# Patient Record
Sex: Female | Born: 1945 | Race: Black or African American | Hispanic: No | Marital: Married | Smoking: Never smoker
Health system: Southern US, Community
[De-identification: ages and names within clinical notes are randomized; demographics above are authoritative.]

## PROBLEM LIST (undated history)

## (undated) DIAGNOSIS — I10 Essential (primary) hypertension: Secondary | ICD-10-CM

## (undated) DIAGNOSIS — E7800 Pure hypercholesterolemia, unspecified: Secondary | ICD-10-CM

## (undated) DIAGNOSIS — I2510 Atherosclerotic heart disease of native coronary artery without angina pectoris: Secondary | ICD-10-CM

## (undated) HISTORY — PX: CORONARY ANGIOPLASTY WITH STENT PLACEMENT: SHX49

---

## 2014-03-11 ENCOUNTER — Emergency Department (HOSPITAL_BASED_OUTPATIENT_CLINIC_OR_DEPARTMENT_OTHER)
Admission: EM | Admit: 2014-03-11 | Discharge: 2014-03-11 | Payer: Medicare HMO | Attending: Emergency Medicine | Admitting: Emergency Medicine

## 2014-03-11 ENCOUNTER — Emergency Department (HOSPITAL_BASED_OUTPATIENT_CLINIC_OR_DEPARTMENT_OTHER): Payer: Medicare HMO

## 2014-03-11 ENCOUNTER — Encounter (HOSPITAL_BASED_OUTPATIENT_CLINIC_OR_DEPARTMENT_OTHER): Payer: Self-pay | Admitting: Emergency Medicine

## 2014-03-11 DIAGNOSIS — R079 Chest pain, unspecified: Secondary | ICD-10-CM | POA: Diagnosis not present

## 2014-03-11 DIAGNOSIS — R9431 Abnormal electrocardiogram [ECG] [EKG]: Secondary | ICD-10-CM | POA: Insufficient documentation

## 2014-03-11 LAB — CBC WITH DIFFERENTIAL/PLATELET
Basophils Absolute: 0 10*3/uL (ref 0.0–0.1)
Basophils Relative: 0 % (ref 0–1)
EOS ABS: 0.2 10*3/uL (ref 0.0–0.7)
EOS PCT: 3 % (ref 0–5)
HCT: 33.6 % — ABNORMAL LOW (ref 36.0–46.0)
HEMOGLOBIN: 11.2 g/dL — AB (ref 12.0–15.0)
Lymphocytes Relative: 37 % (ref 12–46)
Lymphs Abs: 2.4 10*3/uL (ref 0.7–4.0)
MCH: 28.1 pg (ref 26.0–34.0)
MCHC: 33.3 g/dL (ref 30.0–36.0)
MCV: 84.4 fL (ref 78.0–100.0)
MONOS PCT: 6 % (ref 3–12)
Monocytes Absolute: 0.4 10*3/uL (ref 0.1–1.0)
Neutro Abs: 3.4 10*3/uL (ref 1.7–7.7)
Neutrophils Relative %: 54 % (ref 43–77)
PLATELETS: 250 10*3/uL (ref 150–400)
RBC: 3.98 MIL/uL (ref 3.87–5.11)
RDW: 14.2 % (ref 11.5–15.5)
WBC: 6.4 10*3/uL (ref 4.0–10.5)

## 2014-03-11 LAB — COMPREHENSIVE METABOLIC PANEL
ALT: 18 U/L (ref 0–35)
ANION GAP: 14 (ref 5–15)
AST: 29 U/L (ref 0–37)
Albumin: 4.4 g/dL (ref 3.5–5.2)
Alkaline Phosphatase: 71 U/L (ref 39–117)
BUN: 14 mg/dL (ref 6–23)
CO2: 26 mEq/L (ref 19–32)
CREATININE: 0.8 mg/dL (ref 0.50–1.10)
Calcium: 9.8 mg/dL (ref 8.4–10.5)
Chloride: 104 mEq/L (ref 96–112)
GFR calc non Af Amer: 73 mL/min — ABNORMAL LOW (ref >90)
GFR, EST AFRICAN AMERICAN: 85 mL/min — AB (ref >90)
Glucose, Bld: 101 mg/dL — ABNORMAL HIGH (ref 70–99)
Potassium: 3.5 mEq/L — ABNORMAL LOW (ref 3.7–5.3)
Sodium: 144 mEq/L (ref 137–147)
TOTAL PROTEIN: 8.4 g/dL — AB (ref 6.0–8.3)
Total Bilirubin: 0.3 mg/dL (ref 0.3–1.2)

## 2014-03-11 LAB — TROPONIN I: Troponin I: <0.3 ng/mL (ref <0.30)

## 2014-03-11 MED ORDER — ASPIRIN 81 MG PO CHEW
324.0000 mg | CHEWABLE_TABLET | Freq: Once | ORAL | Status: AC
  Administered 2014-03-11: 324 mg via ORAL
  Filled 2014-03-11: qty 4

## 2014-03-11 NOTE — ED Notes (Signed)
Pt reports intermittent chest pressure (centralized) x2 weeks - denies other s/s.

## 2014-03-11 NOTE — ED Provider Notes (Signed)
CSN: [id number]     Arrival date & time 03/11/14  1922 History  This chart was scribed for Glynn OctaveStephen Fernie Grimm, MD by Roxy Cedarhandni Bhalodia, ED Scribe. This patient was seen in room MH12/MH12 and the patient's care was started at 7:48 PM.   Chief Complaint  Patient presents with  . Chest Pain   Patient is a 68 y.o. female presenting with chest pain. The history is provided by the patient. No language interpreter was used.  Chest Pain  HPI Comments: Lorayne BenderLouise Pellecchia is a 68 y.o. female with no chronic medical conditions, who presents to the Emergency Department complaining of intermittent moderate chest pain located in her chest and under her axillary areas bilaterally with one episode that occurred in the past week and another episode earlier today 2.5 hours ago. The pain lasts 1-2 minutes each time. Patient states that she was walking up the stairs when she felt the pain. She states that the pain gradually improved after sitting down. Patient denies associated SOB.  History reviewed. No pertinent past medical history. History reviewed. No pertinent past surgical history. History reviewed. No pertinent family history. History  Substance Use Topics  . Smoking status: Never Smoker   . Smokeless tobacco: Not on file  . Alcohol Use: No   OB History   Grav Para Term Preterm Abortions TAB SAB Ect Mult Living                 Review of Systems  Cardiovascular: Positive for chest pain.   A complete 10 system review of systems was obtained and all systems are negative except as noted in the HPI and PMH.   Allergies  Review of patient's allergies indicates no known allergies.  Home Medications   Prior to Admission medications   Not on File   Triage Vitals: Pulse 81  Temp(Src) 98.7 F (37.1 C) (Oral)  Resp 21  Ht 5\' 6"  (1.676 m)  Wt 135 lb (61.236 kg)  BMI 21.80 kg/m2  SpO2 100%  Physical Exam  Nursing note and vitals reviewed. Constitutional: She is oriented to person, place, and time.  She appears well-developed and well-nourished. No distress.  HENT:  Head: Normocephalic and atraumatic.  Mouth/Throat: Oropharynx is clear and moist. No oropharyngeal exudate.  Eyes: Conjunctivae and EOM are normal. Pupils are equal, round, and reactive to light.  Neck: Normal range of motion. Neck supple.  No meningismus.  Cardiovascular: Normal rate, regular rhythm, normal heart sounds and intact distal pulses.   No murmur heard. Pulmonary/Chest: Effort normal and breath sounds normal. No respiratory distress.  Abdominal: Soft. There is no tenderness. There is no rebound and no guarding.  Musculoskeletal: Normal range of motion. She exhibits no edema and no tenderness.  Neurological: She is alert and oriented to person, place, and time. No cranial nerve deficit. She exhibits normal muscle tone. Coordination normal.  No ataxia on finger to nose bilaterally. No pronator drift. 5/5 strength throughout. CN 2-12 intact. Negative Romberg. Equal grip strength. Sensation intact. Gait is normal.   Skin: Skin is warm.  Psychiatric: She has a normal mood and affect. Her behavior is normal.   ED Course  Procedures (including critical care time)  DIAGNOSTIC STUDIES: Oxygen Saturation is 100% on RA, normal by my interpretation.    COORDINATION OF CARE: 7:52 PM- Discussed plans to order diagnostic lab work and CXR and EKG. Will give patient aspirin today. Pt advised of plan for treatment and pt agrees.  8:57 PM- Discussed normal imaging results. Discussed  plans to admit patient to monitor overnight.  Labs Review Labs Reviewed  CBC WITH DIFFERENTIAL - Abnormal; Notable for the following:    Hemoglobin 11.2 (*)    HCT 33.6 (*)    All other components within normal limits  COMPREHENSIVE METABOLIC PANEL - Abnormal; Notable for the following:    Potassium 3.5 (*)    Glucose, Bld 101 (*)    Total Protein 8.4 (*)    GFR calc non Af Amer 73 (*)    GFR calc Af Amer 85 (*)    All other components  within normal limits  TROPONIN I   Imaging Review Dg Chest 2 View  03/11/2014   CLINICAL DATA:  Intermittent chest pressure for the past 2 weeks.  EXAM: CHEST  2 VIEW  COMPARISON:  None.  FINDINGS: Moderate scoliosis. Normal sized heart. Clear lungs with normal vascularity.  IMPRESSION: No acute abnormality.  Moderate scoliosis.   Electronically Signed   By: Gordan PaymentSteve  Reid M.D.   On: 03/11/2014 20:46     EKG Interpretation None     MDM   Final diagnoses:  Chest pain on exertion  Abnormal EKG   Episode of central chest pain that came on with exertion. It resolved with rest. Pain-free now. EKG with ST depressions laterally. No comparison.  Concern for ACS given exertional chest pain with EKG changes. Patient with no medical history or known risk factors. Troponin negative.  Patient given aspirin. She remains chest pain-free in ED. Given her exertional chest pain with EKG abnormalities she'll be admitted for cardiac rule out. [facility].    Date: 03/11/2014  Rate: 83  Rhythm: normal sinus rhythm  QRS Axis: normal  Intervals: normal  ST/T Wave abnormalities: ST depressions laterally  Conduction Disutrbances:none  Narrative Interpretation: anterior Q waves  Old EKG Reviewed: none available  CRITICAL CARE Performed by: Glynn OctaveANCOUR, Therma Lasure Total critical care time: 30 Critical care time was exclusive of separately billable procedures and treating other patients. Critical care was necessary to treat or prevent imminent or life-threatening deterioration. Critical care was time spent personally by me on the following activities: development of treatment plan with patient and/or surrogate as well as nursing, discussions with consultants, evaluation of patient's response to treatment, examination of patient, obtaining history from patient or surrogate, ordering and performing treatments and interventions, ordering and review of laboratory  studies, ordering and review of radiographic studies, pulse oximetry and re-evaluation of patient's condition.    I personally performed the services described in this documentation, which was scribed in my presence. The recorded information has been reviewed and is accurate.  Glynn OctaveStephen Alawna Graybeal, MD 03/11/14 [phone]

## 2015-02-17 ENCOUNTER — Encounter (HOSPITAL_BASED_OUTPATIENT_CLINIC_OR_DEPARTMENT_OTHER): Payer: Self-pay | Admitting: *Deleted

## 2015-02-17 ENCOUNTER — Emergency Department (HOSPITAL_BASED_OUTPATIENT_CLINIC_OR_DEPARTMENT_OTHER)
Admission: EM | Admit: 2015-02-17 | Discharge: 2015-02-17 | Disposition: A | Discharge status: Home / Self Care | Payer: Medicare HMO | Attending: Physician Assistant | Admitting: Physician Assistant

## 2015-02-17 DIAGNOSIS — K047 Periapical abscess without sinus: Secondary | ICD-10-CM | POA: Diagnosis not present

## 2015-02-17 DIAGNOSIS — I2510 Atherosclerotic heart disease of native coronary artery without angina pectoris: Secondary | ICD-10-CM | POA: Insufficient documentation

## 2015-02-17 DIAGNOSIS — E780 Pure hypercholesterolemia: Secondary | ICD-10-CM | POA: Insufficient documentation

## 2015-02-17 DIAGNOSIS — Z7982 Long term (current) use of aspirin: Secondary | ICD-10-CM | POA: Insufficient documentation

## 2015-02-17 DIAGNOSIS — K029 Dental caries, unspecified: Secondary | ICD-10-CM | POA: Diagnosis not present

## 2015-02-17 DIAGNOSIS — K088 Other specified disorders of teeth and supporting structures: Secondary | ICD-10-CM | POA: Diagnosis present

## 2015-02-17 DIAGNOSIS — I10 Essential (primary) hypertension: Secondary | ICD-10-CM | POA: Insufficient documentation

## 2015-02-17 DIAGNOSIS — Z9861 Coronary angioplasty status: Secondary | ICD-10-CM | POA: Insufficient documentation

## 2015-02-17 DIAGNOSIS — Z79899 Other long term (current) drug therapy: Secondary | ICD-10-CM | POA: Insufficient documentation

## 2015-02-17 HISTORY — DX: Pure hypercholesterolemia, unspecified: E78.00

## 2015-02-17 HISTORY — DX: Atherosclerotic heart disease of native coronary artery without angina pectoris: I25.10

## 2015-02-17 HISTORY — DX: Essential (primary) hypertension: I10

## 2015-02-17 MED ORDER — PENICILLIN V POTASSIUM 250 MG PO TABS
500.0000 mg | ORAL_TABLET | Freq: Once | ORAL | Status: AC
  Administered 2015-02-17: 500 mg via ORAL
  Filled 2015-02-17: qty 2

## 2015-02-17 MED ORDER — PENICILLIN V POTASSIUM 500 MG PO TABS: 500.0000 mg | ORAL_TABLET | Freq: Three times a day (TID) | ORAL | Status: DC

## 2015-02-17 NOTE — ED Notes (Signed)
Right lower dental pain x 1 day. Swelling noted

## 2015-02-17 NOTE — Discharge Instructions (Signed)
Please read and follow all provided instructions.  Your diagnoses today include:  1. Dental abscess     The exam and treatment you received today has been provided on an emergency basis only. This is not a substitute for complete medical or dental care.  Tests performed today include:  Vital signs. See below for your results today.   Medications prescribed:   Penicillin - antibiotic  You have been prescribed an antibiotic medicine: take the entire course of medicine even if you are feeling better. Stopping early can cause the antibiotic not to work.  Take any prescribed medications only as directed.  Home care instructions:  Follow any educational materials contained in this packet.  Follow-up instructions: Please follow-up with your dentist for further evaluation of your symptoms.   Dental Assistance: See below for dental referrals  Return instructions:   Please return to the Emergency Department if you experience worsening symptoms.  Please return if you develop a fever, you develop more swelling in your face or neck, you have trouble breathing or swallowing food.  Please return if you have any other emergent concerns.  Additional Information:  -------------- Dental Care: Organization         Address  Phone  Notes  [facility] Department of [facility] [address] [place], [place] [phone] Accepts children up to age 36 who are enrolled in [place] or [place] Health Choice; pregnant women with a Medicaid card; and children who have applied for Medicaid or [place] Health Choice, but were declined, whose parents can pay a reduced fee at time of service.  [facility] Department of [facility]  [address], [place] [phone] Accepts children up to age 96 who are enrolled in [place] or [place] Health Choice; pregnant women with a Medicaid card; and children who have applied for Medicaid or  Health Choice, but were  declined, whose parents can pay a reduced fee at time of service.  [facility]  [address] [place], [place] [phone] Patients are seen by appointment only. Walk-ins are not accepted. [facility] will see patients 44 years of age and older. Monday - Tuesday (8am-5pm) Most Wednesdays (8:30-5pm) $30 per visit, cash only  [facility]  [address], [facility] [phone] Patients are seen by appointment only. Walk-ins are not accepted. [facility] will see patients 42 years of age and older. One Wednesday Evening (Monthly: Volunteer Based).  $30 per visit, cash only  [employer] [facility] [employer]  [phone] for adults; Children under age 48, call Graduate Pediatric Dentistry at [phone]. Children aged 52-14, please call [phone] to request a pediatric application.  Dental services are provided in all areas of dental care including fillings, crowns and bridges, complete and partial dentures, implants, gum treatment, root canals, and extractions. Preventive care is also provided. Treatment is provided to both adults and children. Patients are selected via a lottery and there is often a waiting list.   [facility] [address], [place]  [phone] [email or web address]   [facility] [address] [place], [place] [phone], Ext. [phone] Second and Fourth Thursday of each month, opens at 6:30 AM; Clinic ends at 9 AM.  Patients are seen on a first-come first-served basis, and a limited number are seen during each clinic.   [facility]  [address], [place], [place] [phone])  [phone]   Eligibility Requirements You must have lived in [place], [place], or [place] counties for at least the last three months.   You cannot be eligible for state or federal sponsored [place], including [employer], [place], or [employer].   You generally cannot be  eligible for healthcare insurance through your employer.    How to apply: Eligibility screenings are held every Tuesday and Wednesday afternoon from 1:00 pm until 4:00 pm. You do not need an appointment for the interview!  [facility] [address] [place], [place] [phone]   [facility] Health Department  [phone]   [facility] Department  [phone]   [facility]  [phone]

## 2015-02-17 NOTE — ED Provider Notes (Signed)
CSN: [id number]     Arrival date & time 02/17/15  1939 History   First MD Initiated Contact with Patient 02/17/15 2111     Chief Complaint  Patient presents with  . Dental Pain     (Consider location/radiation/quality/duration/timing/severity/associated sxs/prior Treatment) HPI Comments: Patient presents with complaint of right lower dental pain and jaw swelling for the past 1 day. No fevers, neck swelling, difficulty breathing or swallowing. No treatments prior to arrival. Patient has poor dentition and has h/o multiple dental extractions. The onset of this condition was acute. The course is constant. Aggravating factors: none. Alleviating factors: none.     The history is provided by the patient.    Past Medical History  Diagnosis Date  . Hypertension   . Coronary artery disease   . High cholesterol    Past Surgical History  Procedure Laterality Date  . Coronary angioplasty with stent placement     No family history on file. Social History  Substance Use Topics  . Smoking status: Never Smoker   . Smokeless tobacco: None  . Alcohol Use: No   OB History    No data available     Review of Systems  Constitutional: Negative for fever.  HENT: Positive for dental problem and facial swelling. Negative for ear pain, sore throat and trouble swallowing.   Respiratory: Negative for shortness of breath and stridor.   Musculoskeletal: Negative for neck pain.  Skin: Negative for color change.  Neurological: Negative for headaches.      Allergies  Tylenol  Home Medications   Prior to Admission medications   Medication Sig Start Date End Date Taking? Authorizing Provider  amLODipine (NORVASC) 5 MG tablet Take 5 mg by mouth daily.   Yes Historical Provider, MD  aspirin 81 MG tablet Take 81 mg by mouth daily.   Yes Historical Provider, MD  metoprolol succinate (TOPROL-XL) 25 MG 24 hr tablet Take 25 mg by mouth daily.   Yes Historical Provider, MD  pravastatin (PRAVACHOL) 40  MG tablet Take 40 mg by mouth daily.   Yes Historical Provider, MD  ticagrelor (BRILINTA) 90 MG TABS tablet Take 90 mg by mouth 2 (two) times daily.   Yes Historical Provider, MD   There were no vitals taken for this visit. Physical Exam  Constitutional: She appears well-developed and well-nourished.  HENT:  Head: Normocephalic and atraumatic.  Right Ear: Tympanic membrane, external ear and ear canal normal.  Left Ear: Tympanic membrane, external ear and ear canal normal.  Nose: Nose normal.  Mouth/Throat: Uvula is midline, oropharynx is clear and moist and mucous membranes are normal. No trismus in the jaw. Abnormal dentition. Dental caries present. No dental abscesses or uvula swelling. No tonsillar abscesses.  Patient with right mandibular jaw swelling and tenderness in area of tooth #27.  Eyes: Conjunctivae are normal.  Neck: Normal range of motion. Neck supple.  No neck swelling or Ludwig's angina  Lymphadenopathy:    She has no cervical adenopathy.  Neurological: She is alert.  Skin: Skin is warm and dry.  Psychiatric: She has a normal mood and affect.  Nursing note and vitals reviewed.   ED Course  Procedures (including critical care time) Labs Review Labs Reviewed - No data to display  Imaging Review No results found. I have personally reviewed and evaluated these images and lab results as part of my medical decision-making.   EKG Interpretation None       9:29 PM Patient seen and examined. Medications ordered.  Vital signs reviewed and are as follows: BP 158/78 mmHg  Pulse 104  Temp(Src) 99.6 F (37.6 C) (Oral)  Resp 18  Ht  (1.676 m)  Wt 135 lb (61.236 kg)  BMI 21.80 kg/m2  SpO2 100%  Patient counseled to take prescribed medications as directed, return with worsening facial or neck swelling, and to follow-up with their dentist as soon as possible.    MDM   Final diagnoses:  Dental abscess   Patient with toothache. No fever. Exam unconcerning  for Ludwig's angina or other deep tissue infection in neck.   As there is gum swelling, erythema, and facial swelling, will treat with antibiotic. Urged patient to follow-up with dentist.       Renne Crigler, PA-C 02/17/15 2241  Courteney Randall An, MD 02/17/15 2259

## 2015-02-24 ENCOUNTER — Emergency Department (HOSPITAL_BASED_OUTPATIENT_CLINIC_OR_DEPARTMENT_OTHER): Payer: Medicare HMO

## 2015-02-24 ENCOUNTER — Encounter (HOSPITAL_BASED_OUTPATIENT_CLINIC_OR_DEPARTMENT_OTHER): Payer: Self-pay | Admitting: Emergency Medicine

## 2015-02-24 ENCOUNTER — Emergency Department (HOSPITAL_BASED_OUTPATIENT_CLINIC_OR_DEPARTMENT_OTHER)
Admission: EM | Admit: 2015-02-24 | Discharge: 2015-02-24 | Disposition: A | Discharge status: Other Acute Inpatient Hospital | Payer: Medicare HMO | Attending: Emergency Medicine | Admitting: Emergency Medicine

## 2015-02-24 DIAGNOSIS — I2510 Atherosclerotic heart disease of native coronary artery without angina pectoris: Secondary | ICD-10-CM | POA: Insufficient documentation

## 2015-02-24 DIAGNOSIS — Z7982 Long term (current) use of aspirin: Secondary | ICD-10-CM | POA: Diagnosis not present

## 2015-02-24 DIAGNOSIS — R569 Unspecified convulsions: Secondary | ICD-10-CM | POA: Diagnosis present

## 2015-02-24 DIAGNOSIS — E7800 Pure hypercholesterolemia, unspecified: Secondary | ICD-10-CM | POA: Insufficient documentation

## 2015-02-24 DIAGNOSIS — Z79899 Other long term (current) drug therapy: Secondary | ICD-10-CM | POA: Diagnosis not present

## 2015-02-24 DIAGNOSIS — R4020 Unspecified coma: Secondary | ICD-10-CM | POA: Diagnosis not present

## 2015-02-24 DIAGNOSIS — R4189 Other symptoms and signs involving cognitive functions and awareness: Secondary | ICD-10-CM

## 2015-02-24 DIAGNOSIS — Z9861 Coronary angioplasty status: Secondary | ICD-10-CM | POA: Insufficient documentation

## 2015-02-24 DIAGNOSIS — I10 Essential (primary) hypertension: Secondary | ICD-10-CM | POA: Insufficient documentation

## 2015-02-24 LAB — URINALYSIS, ROUTINE W REFLEX MICROSCOPIC
Bilirubin Urine: NEGATIVE
Glucose, UA: NEGATIVE mg/dL
HGB URINE DIPSTICK: NEGATIVE
Ketones, ur: NEGATIVE mg/dL
Leukocytes, UA: NEGATIVE
NITRITE: NEGATIVE
Protein, ur: NEGATIVE mg/dL
Specific Gravity, Urine: 1.016 (ref 1.005–1.030)
Urobilinogen, UA: 1 mg/dL (ref 0.0–1.0)
pH: 6 (ref 5.0–8.0)

## 2015-02-24 LAB — CBC
HCT: 26.4 % — ABNORMAL LOW (ref 36.0–46.0)
Hemoglobin: 8.6 g/dL — ABNORMAL LOW (ref 12.0–15.0)
MCH: 27.7 pg (ref 26.0–34.0)
MCHC: 32.6 g/dL (ref 30.0–36.0)
MCV: 85.2 fL (ref 78.0–100.0)
PLATELETS: 328 10*3/uL (ref 150–400)
RBC: 3.1 MIL/uL — AB (ref 3.87–5.11)
RDW: 13.7 % (ref 11.5–15.5)
WBC: 8.6 10*3/uL (ref 4.0–10.5)

## 2015-02-24 LAB — BASIC METABOLIC PANEL
Anion gap: 9 (ref 5–15)
BUN: 32 mg/dL — ABNORMAL HIGH (ref 6–20)
CO2: 28 mmol/L (ref 22–32)
CREATININE: 0.82 mg/dL (ref 0.44–1.00)
Calcium: 8.8 mg/dL — ABNORMAL LOW (ref 8.9–10.3)
Chloride: 107 mmol/L (ref 101–111)
GFR calc non Af Amer: >60 mL/min (ref >60)
Glucose, Bld: 138 mg/dL — ABNORMAL HIGH (ref 65–99)
Potassium: 2.8 mmol/L — ABNORMAL LOW (ref 3.5–5.1)
Sodium: 144 mmol/L (ref 135–145)

## 2015-02-24 LAB — CBG MONITORING, ED: Glucose-Capillary: 126 mg/dL — ABNORMAL HIGH (ref 65–99)

## 2015-02-24 MED ORDER — POTASSIUM CHLORIDE CRYS ER 20 MEQ PO TBCR
40.0000 meq | EXTENDED_RELEASE_TABLET | Freq: Once | ORAL | Status: AC
  Administered 2015-02-24: 40 meq via ORAL
  Filled 2015-02-24: qty 2

## 2015-02-24 MED ORDER — MORPHINE SULFATE (PF) 4 MG/ML IV SOLN: 4.0000 mg | Freq: Once | INTRAVENOUS | Status: DC

## 2015-02-24 MED ORDER — GI COCKTAIL ~~LOC~~: 30.0000 mL | Freq: Once | ORAL | Status: DC

## 2015-02-24 MED ORDER — SUCRALFATE 1 G PO TABS: 1.0000 g | ORAL_TABLET | Freq: Once | ORAL | Status: DC

## 2015-02-24 NOTE — ED Notes (Signed)
Pt lost control of bladder during event, alert and oriented to place, but no memory of events prior to seizure

## 2015-02-24 NOTE — ED Notes (Signed)
Pt had seizure like activity in vehicle this afternoon while traveling home, pt reports recent dental work and has history of heart problems

## 2015-02-24 NOTE — ED Provider Notes (Signed)
CSN: [id number]     Arrival date & time 02/24/15  1349 History   First MD Initiated Contact with Patient 02/24/15 1449     Chief Complaint  Patient presents with  . Seizures     (Consider location/radiation/quality/duration/timing/severity/associated sxs/prior Treatment) HPI Comments: Patient was riding in the car with her husband and had a 10-15 second episode where she went unresponsive and had generalized shaking. She had no post-ictal period, she woke up like nothing had happened. She reported feeling tired prior to the episode, but denied chest pain, dizziness, shortness of breath, fever. Recent tooth extraction for a dental abscess, she is markedly improving since the tooth extraction.  Patient is a 69 y.o. female presenting with seizures. The history is provided by the patient.  Seizures Seizure activity on arrival: no   Seizure type:  Grand mal Preceding symptoms: no headache and no nausea   Initial focality:  Diffuse Episode characteristics: generalized shaking, incontinence and unresponsiveness   Severity:  Moderate Timing:  Once Progression:  Worsening Context: not family hx of seizures, not fever, not intracranial lesion and not intracranial shunt     Past Medical History  Diagnosis Date  . Hypertension   . Coronary artery disease   . High cholesterol    Past Surgical History  Procedure Laterality Date  . Coronary angioplasty with stent placement     History reviewed. No pertinent family history. Social History  Substance Use Topics  . Smoking status: Never Smoker   . Smokeless tobacco: None  . Alcohol Use: No   OB History    No data available     Review of Systems  Constitutional: Negative for fever.  Respiratory: Negative for cough and shortness of breath.   Cardiovascular: Negative for chest pain and leg swelling.  Gastrointestinal: Negative for vomiting and abdominal pain.  Neurological: Positive for seizures.  All other systems reviewed and are  negative.     Allergies  Tylenol  Home Medications   Prior to Admission medications   Medication Sig Start Date End Date Taking? Authorizing Provider  amLODipine (NORVASC) 5 MG tablet Take 5 mg by mouth daily.   Yes Historical Provider, MD  amoxicillin (AMOXIL) 250 MG capsule Take 250 mg by mouth 3 (three) times daily.   Yes Historical Provider, MD  aspirin 81 MG tablet Take 81 mg by mouth daily.   Yes Historical Provider, MD  ibuprofen (ADVIL,MOTRIN) 200 MG tablet Take 200 mg by mouth every 6 (six) hours as needed.   Yes Historical Provider, MD  metoprolol succinate (TOPROL-XL) 25 MG 24 hr tablet Take 25 mg by mouth daily.   Yes Historical Provider, MD  pravastatin (PRAVACHOL) 40 MG tablet Take 40 mg by mouth daily.   Yes Historical Provider, MD  ticagrelor (BRILINTA) 90 MG TABS tablet Take 90 mg by mouth 2 (two) times daily.   Yes Historical Provider, MD   BP 124/69 mmHg  Pulse 82  Temp(Src) 98.6 F (37 C) (Oral)  Resp 16  Ht  (1.676 m)  Wt 134 lb (60.782 kg)  BMI 21.64 kg/m2  SpO2 100% Physical Exam  Constitutional: She is oriented to person, place, and time. She appears well-developed and well-nourished. No distress.  HENT:  Head: Normocephalic and atraumatic.  Mouth/Throat: Oropharynx is clear and moist.  Eyes: EOM are normal. Pupils are equal, round, and reactive to light.  Neck: Normal range of motion. Neck supple.  Cardiovascular: Normal rate and regular rhythm.  Exam reveals no friction rub.  No murmur heard. Pulmonary/Chest: Effort normal and breath sounds normal. No respiratory distress. She has no wheezes. She has no rales.  Abdominal: Soft. She exhibits no distension. There is no tenderness. There is no rebound.  Musculoskeletal: Normal range of motion. She exhibits no edema.  Neurological: She is alert and oriented to person, place, and time. No cranial nerve deficit. She exhibits normal muscle tone. Coordination normal.  Skin: No rash noted. She is not  diaphoretic.  Nursing note and vitals reviewed.   ED Course  Procedures (including critical care time) Labs Review Labs Reviewed  CBC - Abnormal; Notable for the following:    RBC 3.10 (*)    Hemoglobin 8.6 (*)    HCT 26.4 (*)    All other components within normal limits  CBG MONITORING, ED - Abnormal; Notable for the following:    Glucose-Capillary 126 (*)    All other components within normal limits  URINALYSIS, ROUTINE W REFLEX MICROSCOPIC (NOT AT [facility])  BASIC METABOLIC PANEL    Imaging Review Ct Head Wo Contrast  02/24/2015   CLINICAL DATA:  New onset seizure today.  EXAM: CT HEAD WITHOUT CONTRAST  TECHNIQUE: Contiguous axial images were obtained from the base of the skull through the vertex without intravenous contrast.  COMPARISON:  None.  FINDINGS: No intracranial abnormalities are identified, including mass lesion or mass effect, hydrocephalus, extra-axial fluid collection, midline shift, hemorrhage, or acute infarction.  The visualized bony calvarium is unremarkable.  IMPRESSION: Unremarkable noncontrast head CT.   Electronically Signed   By: Harmon Pier M.D.   On: 02/24/2015 15:51   I have personally reviewed and evaluated these images and lab results as part of my medical decision-making.   EKG Interpretation   Date/Time:  Saturday February 24 2015 14:15:48 EDT Ventricular Rate:  83 PR Interval:  176 QRS Duration: 74 QT Interval:  352 QTC Calculation: 413 R Axis:   80 Text Interpretation:  Normal sinus rhythm Normal ECG No significant change  since last tracing Confirmed by Gwendolyn Grant  MD, Carston Riedl ([id number]) on 02/24/2015  3:40:39 PM      MDM   Final diagnoses:  Unresponsive episode    58F here with an episode of generalized shaking, unresponsiveness, urinary incontinence. No prior hx of seizures. Some features are typical of seizure like the shaking and incontinence, however without post-ictal period and no history of seizures, I wonder if this could've been a syncopal  episode. Patient doing well now, normal neuro exam. She is on antiplatelet medications, will scan her head. She has recently started antibiotics, but no other meds. Normal CT Head. I think that she likely had a syncopal event. With her cardiac history, will admit to [facility].    Elwin Mocha, MD 02/24/15 [phone]

## 2016-12-12 IMAGING — CT CT HEAD W/O CM
1 series · 16 of 30 positions shown, 20 images · non-contrast
Comparison: None.

CLINICAL DATA: New onset seizure today.

EXAM:
CT HEAD WITHOUT CONTRAST
TECHNIQUE: Contiguous axial images were obtained from the base of the skull
through the vertex without intravenous contrast.

[Series 2: head 4.8 h37s · axial · 0.45mm/px · z∈[-168,-35]mm · 16 of 32 slices shown, 20 images]
[im 2/32  brain]
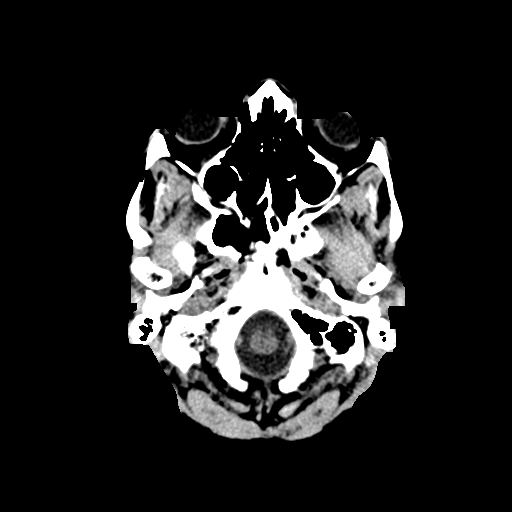
[im 2/32  bone]
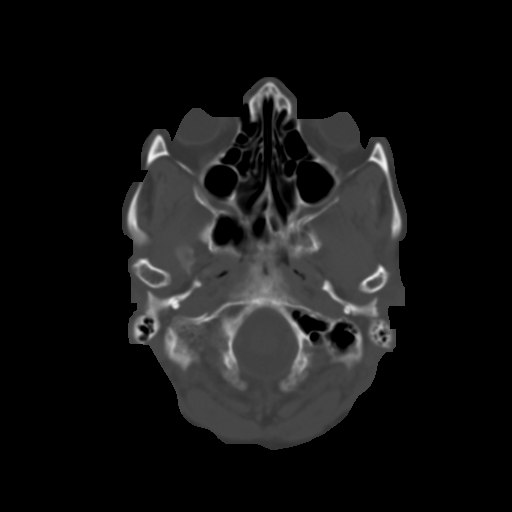
[im 4/32  brain]
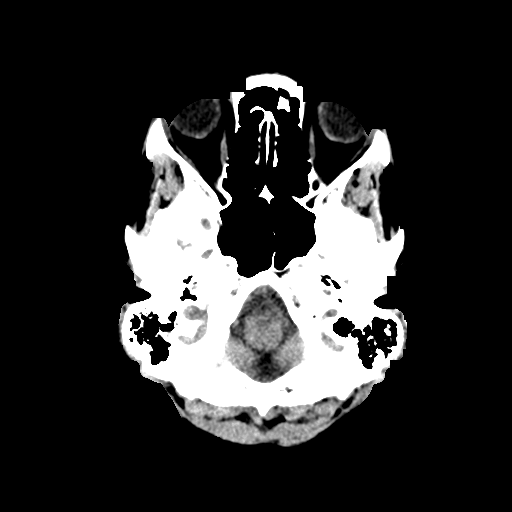
[im 6/32  brain]
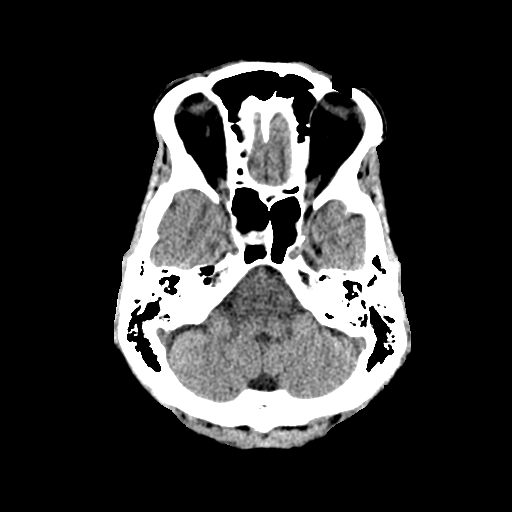
[im 8/32  brain]
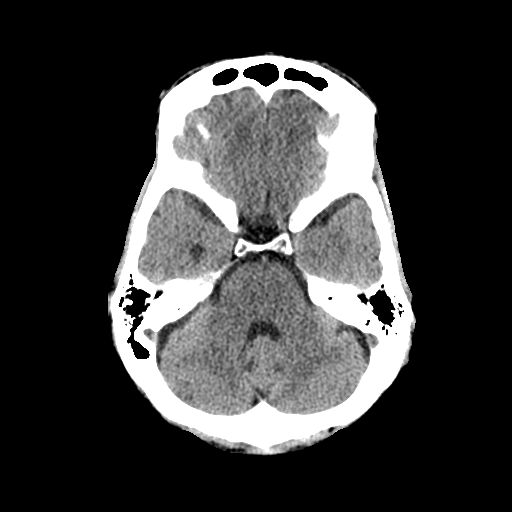
[im 9/32  brain]
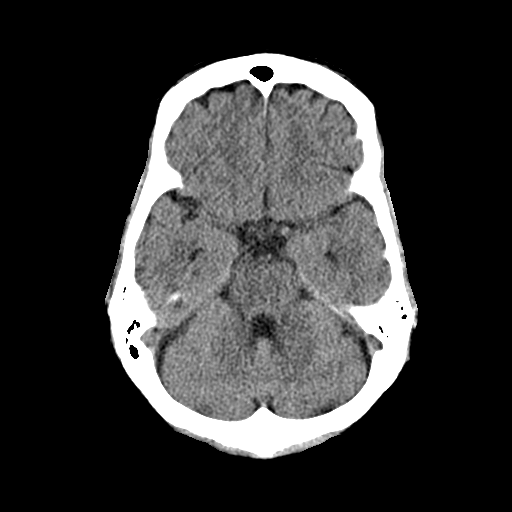
[im 9/32  bone]
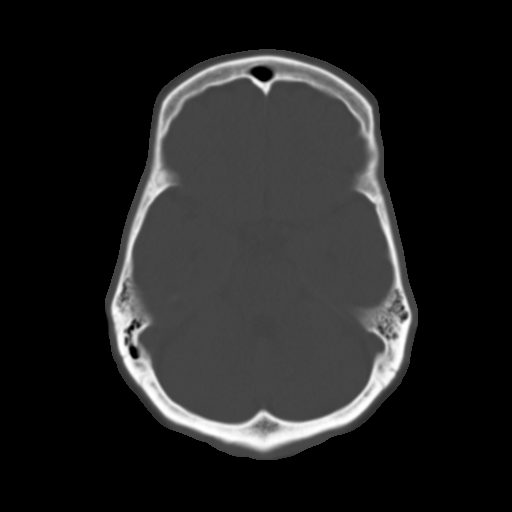
[im 11/32  brain]
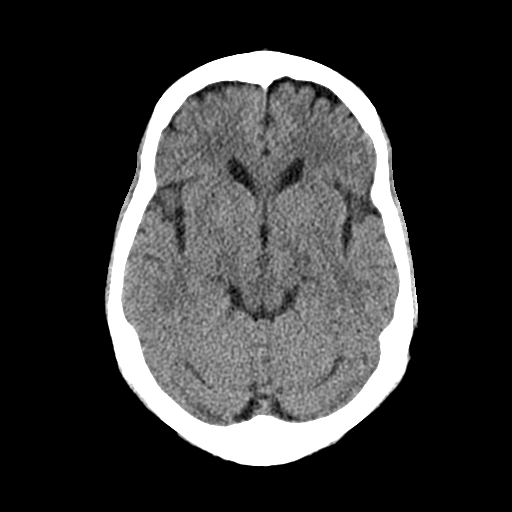
[im 13/32  brain]
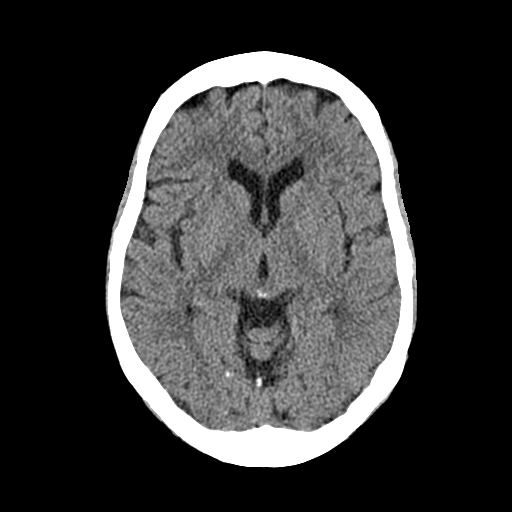
[im 15/32  brain]
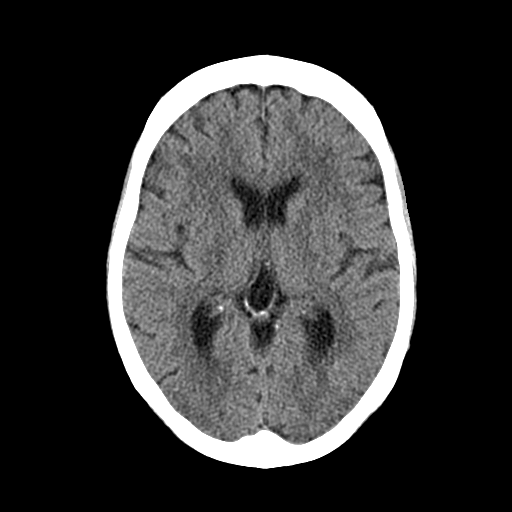
[im 17/32  brain]
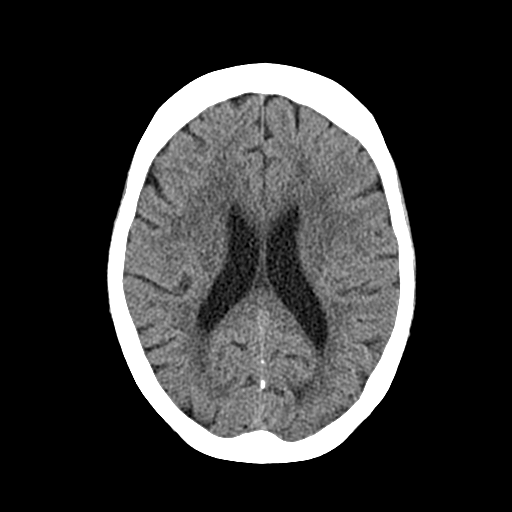
[im 17/32  bone]
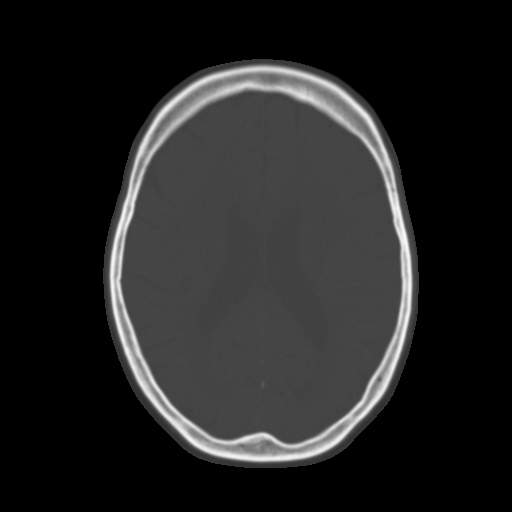
[im 19/32  brain]
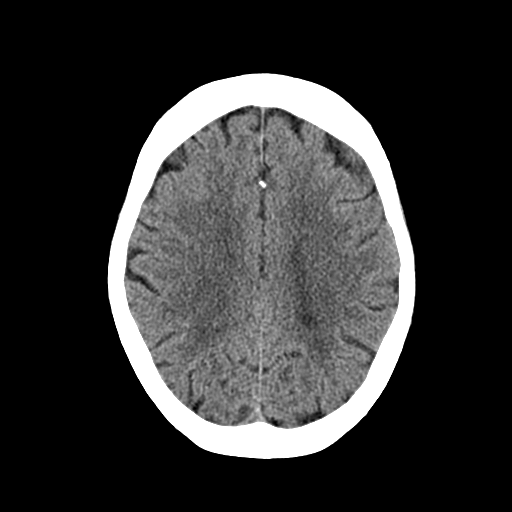
[im 21/32  brain]
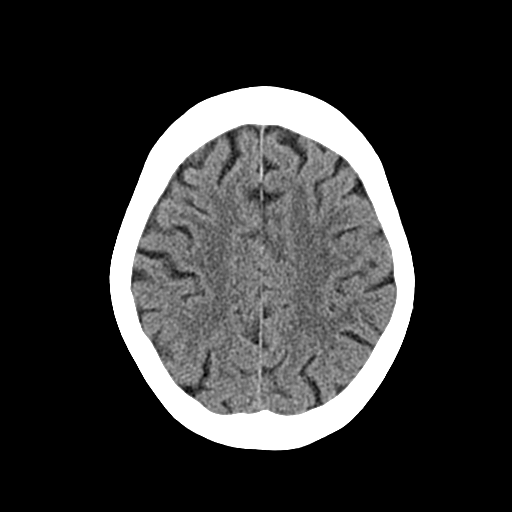
[im 23/32  brain]
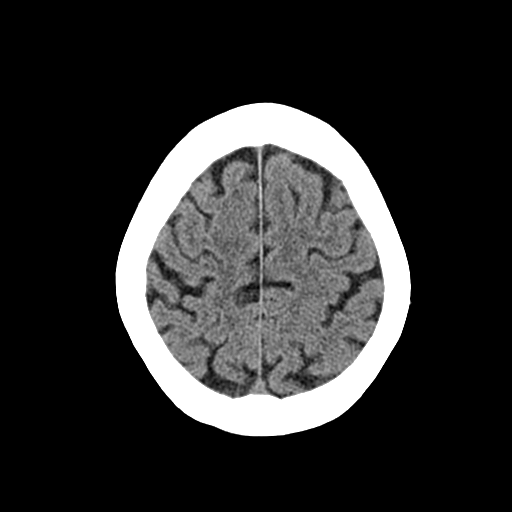
[im 24/32  brain]
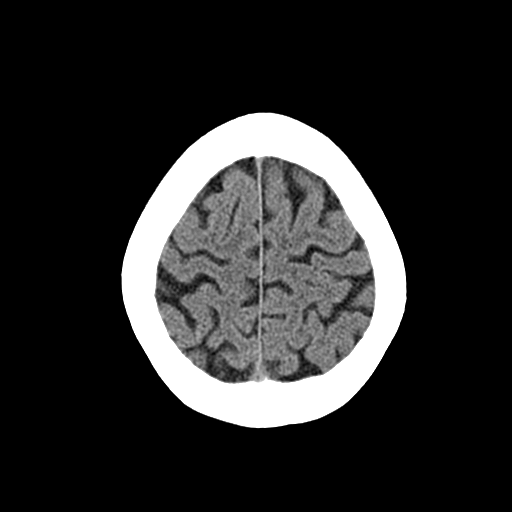
[im 24/32  bone]
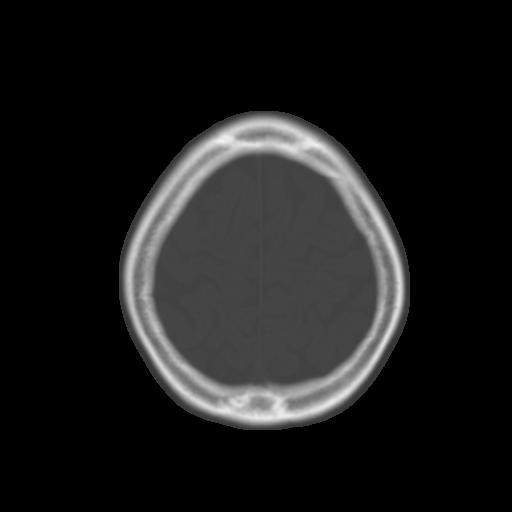
[im 26/32  brain]
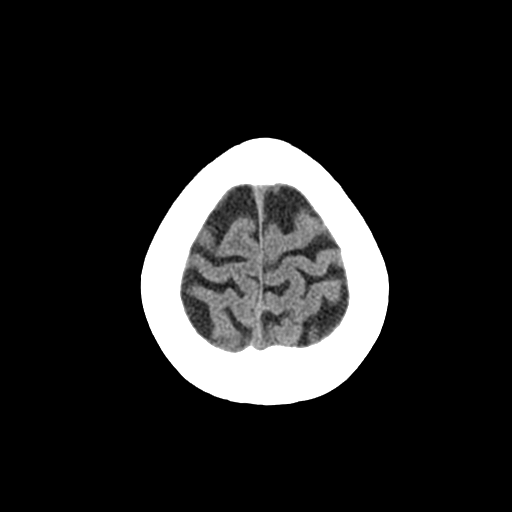
[im 28/32  brain]
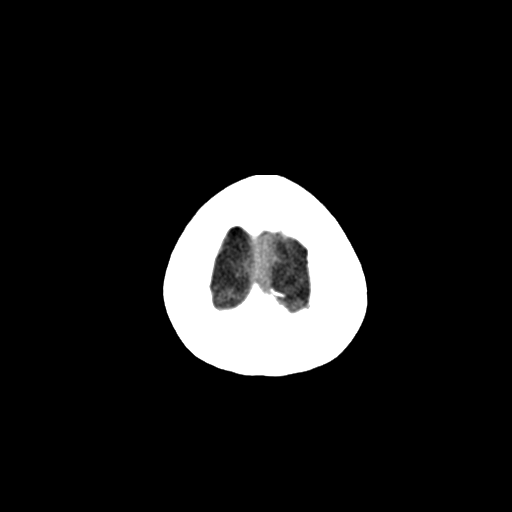
[im 30/32  brain]
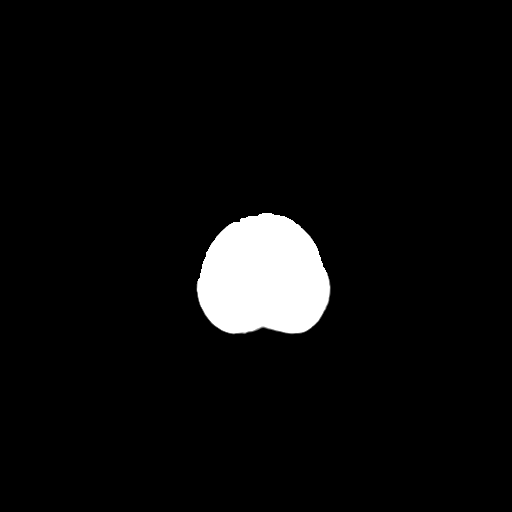

[16 of 30 positions shown; findings below may reference images not displayed]

FINDINGS: No intracranial abnormalities are identified, including mass lesion
or mass effect, hydrocephalus, extra-axial fluid collection, midline
shift, hemorrhage, or acute infarction.

The visualized bony calvarium is unremarkable.
IMPRESSION: Unremarkable noncontrast head CT.

## 2019-07-25 ENCOUNTER — Ambulatory Visit: Payer: Commercial Managed Care - HMO | Attending: Internal Medicine

## 2019-07-25 DIAGNOSIS — Z23 Encounter for immunization: Secondary | ICD-10-CM | POA: Insufficient documentation

## 2019-07-25 NOTE — Progress Notes (Signed)
   Covid-19 Vaccination Clinic  Name:  Sherri Porter    MRN: [id number] DOB: [birth year]  07/25/2019  Sherri Porter was observed post Covid-19 immunization for 15 minutes without incidence. She was provided with Vaccine Information Sheet and instruction to access the V-Safe system.   Sherri Porter was instructed to call 911 with any severe reactions post vaccine: Marland Kitchen Difficulty breathing  . Swelling of your face and throat  . A fast heartbeat  . A bad rash all over your body  . Dizziness and weakness    Immunizations Administered    Name Date Dose VIS Date Route   Pfizer COVID-19 Vaccine 07/25/2019 10:53 AM 0.3 mL 05/06/2019 Intramuscular   Manufacturer: [employer], [employer]   Lot: [id number]   NDC: [address]-4

## 2019-08-23 ENCOUNTER — Ambulatory Visit: Payer: Medicare HMO | Attending: Internal Medicine

## 2019-08-23 DIAGNOSIS — Z23 Encounter for immunization: Secondary | ICD-10-CM

## 2019-08-23 NOTE — Progress Notes (Signed)
   Covid-19 Vaccination Clinic  Name:  Silva Aamodt    MRN: [id number] DOB: Sep 16, 1945  08/23/2019  Ms. Jarmon was observed post Covid-19 immunization for 15 minutes without incident. She was provided with Vaccine Information Sheet and instruction to access the V-Safe system.   Ms. Pyper was instructed to call 911 with any severe reactions post vaccine: Marland Kitchen Difficulty breathing  . Swelling of face and throat  . A fast heartbeat  . A bad rash all over body  . Dizziness and weakness   Immunizations Administered    Name Date Dose VIS Date Route   Pfizer COVID-19 Vaccine 08/23/2019 11:10 AM 0.3 mL 05/06/2019 Intramuscular   Manufacturer: [employer], [employer]   Lot: [id number]   NDC: [address]-3

## 2021-07-26 ENCOUNTER — Ambulatory Visit: Payer: Self-pay | Admitting: *Deleted

## 2021-07-26 NOTE — Telephone Encounter (Signed)
?  Chief Complaint: spotting ?Symptoms: vaginal spotting  ?Frequency: once ?Pertinent Negatives: Patient denies abd pain ?Disposition: [] ED /[] Urgent Care (no appt availability in office) / [] Appointment(In office/virtual)/ []  [facility]/ [] Home Care/ [] Refused Recommended Disposition /[] [facility]/ [x]  Follow-up with PCP ?Additional Notes: Pt already left message at her PCP (not in our practice) and is waiting to hear back about appt. ?Reason for Disposition ? Postmenopausal vaginal bleeding ? ?Answer Assessment - Initial Assessment Questions ?1. AMOUNT: "Describe the bleeding that you are having." "How much bleeding is there?"  ?  - SPOTTING: spotting, or pinkish / brownish mucous discharge; does not fill panty liner or pad  ?  - MILD:  less than 1 pad / hour; less than patient's usual menstrual bleeding ?  - MODERATE: 1-2 pads / hour; 1 menstrual cup every 6 hours; small-medium blood clots (e.g., pea, grape, small coin) ?  - SEVERE: soaking 2 or more pads/hour for 2 or more hours; 1 menstrual cup every 2 hours; bleeding not contained by pads or continuous red blood from vagina; large blood clots (e.g., golf ball, large coin)  ?    spotting ?2. ONSET: "When did the bleeding begin?" "Is it continuing now?" ?    today ?3. MENOPAUSE: "When was your last menstrual period?"  ?    Menopause, 76 yo ?4. ABDOMINAL PAIN: "Do you have any pain?" "How bad is the pain?"  (e.g., Scale 1-10; mild, moderate, or severe) ?  - MILD (1-3): doesn't interfere with normal activities, abdomen soft and not tender to touch  ?  - MODERATE (4-7): interferes with normal activities or awakens from sleep, abdomen tender to touch  ?  - SEVERE (8-10): excruciating pain, doubled over, unable to do any normal activities  ?    no ?5. BLOOD THINNERS: "Do you take any blood thinners?" (e.g., Coumadin/warfarin, Pradaxa/dabigatran, aspirin) ?    Asa 81mg  ?6. HORMONES: "Are you taking any hormone medications, prescription or  OTC?" (e.g., birth control pills, estrogen) ?    no ?7. CAUSE: "What do you think is causing the bleeding?" (e.g., recent gyn surgery, recent gyn procedure; known bleeding disorder, uterine cancer)   ?    unknown ?8. HEMODYNAMIC STATUS: "Are you weak or feeling lightheaded?" If Yes, ask: "Can you stand and walk normally?"   ?    no ?9. OTHER SYMPTOMS: "What other symptoms are you having with the bleeding?" (e.g., back pain, burning with urination, fever) ?    no ? ?Protocols used: Vaginal Bleeding - Postmenopausal-A-AH ? ?

## 2023-09-19 ENCOUNTER — Encounter (HOSPITAL_COMMUNITY): Payer: Self-pay

## 2023-09-19 ENCOUNTER — Emergency Department (HOSPITAL_COMMUNITY)
Admission: EM | Admit: 2023-09-19 | Discharge: 2023-09-19 | Disposition: A | Discharge status: Home / Self Care | Attending: Emergency Medicine | Admitting: Emergency Medicine

## 2023-09-19 ENCOUNTER — Other Ambulatory Visit: Payer: Self-pay

## 2023-09-19 DIAGNOSIS — K625 Hemorrhage of anus and rectum: Secondary | ICD-10-CM

## 2023-09-19 DIAGNOSIS — Z7982 Long term (current) use of aspirin: Secondary | ICD-10-CM | POA: Insufficient documentation

## 2023-09-19 DIAGNOSIS — K602 Anal fissure, unspecified: Secondary | ICD-10-CM | POA: Insufficient documentation

## 2023-09-19 DIAGNOSIS — K644 Residual hemorrhoidal skin tags: Secondary | ICD-10-CM | POA: Insufficient documentation

## 2023-09-19 MED ORDER — NIFEDIPINE 0.3 % OINTMENT: 1.0000 | TOPICAL_OINTMENT | Freq: Four times a day (QID) | CUTANEOUS | 0 refills | Status: AC

## 2023-09-19 NOTE — ED Provider Notes (Signed)
 [place] [facility] AT [facility] Provider Note   CSN: [id number] Arrival date & time: 09/19/23  1356     History  Chief Complaint  Patient presents with   Rectal Bleeding    Sherri Porter is a 78 y.o. female.  78 year old female presents with rectal bleeding which began today.  Patient states that after she moved her bowels she has some bright red blood per rectum.  She denied any pain associated with this.  Denied any blood in her stool.  Does not take any blood thinners.  Has had similar symptoms in the past she said which was treated with a cream.  I did ask her if she had anal fissures or internal hemorrhoids and she was unsure.  She denies any weakness at this time.       Home Medications Prior to Admission medications   Medication Sig Start Date End Date Taking? Authorizing Provider  amLODipine (NORVASC) 5 MG tablet Take 5 mg by mouth daily.    [provider]  amoxicillin (AMOXIL) 250 MG capsule Take 250 mg by mouth 3 (three) times daily.    [provider]  aspirin  81 MG tablet Take 81 mg by mouth daily.    [provider]  ibuprofen (ADVIL,MOTRIN) 200 MG tablet Take 200 mg by mouth every 6 (six) hours as needed.    [provider]  metoprolol succinate (TOPROL-XL) 25 MG 24 hr tablet Take 25 mg by mouth daily.    [provider]  pravastatin (PRAVACHOL) 40 MG tablet Take 40 mg by mouth daily.    [provider]  ticagrelor (BRILINTA) 90 MG TABS tablet Take 90 mg by mouth 2 (two) times daily.    [provider]      Allergies    Ibuprofen and Tylenol [acetaminophen]    Review of Systems   Review of Systems  All other systems reviewed and are negative.   Physical Exam Updated Vital Signs BP (!) 152/98 (BP Location: Right Arm)   Pulse (!) 106   Temp 98.1 F (36.7 C) (Oral)   Resp 18   Ht 1.676 m (5\' 6" )   Wt 54.4 kg   SpO2 100%   BMI 19.37 kg/m  Physical Exam Vitals  and nursing note reviewed. Exam conducted with a chaperone present.  Constitutional:      General: She is not in acute distress.    Appearance: Normal appearance. She is well-developed. She is not toxic-appearing.  HENT:     Head: Normocephalic and atraumatic.  Eyes:     General: Lids are normal.     Conjunctiva/sclera: Conjunctivae normal.     Pupils: Pupils are equal, round, and reactive to light.  Neck:     Thyroid: No thyroid mass.     Trachea: No tracheal deviation.  Cardiovascular:     Rate and Rhythm: Normal rate and regular rhythm.     Heart sounds: Normal heart sounds. No murmur heard.    No gallop.  Pulmonary:     Effort: Pulmonary effort is normal. No respiratory distress.     Breath sounds: Normal breath sounds. No stridor. No decreased breath sounds, wheezing, rhonchi or rales.  Abdominal:     General: There is no distension.     Palpations: Abdomen is soft.     Tenderness: There is no abdominal tenderness. There is no rebound.  Genitourinary:    Rectum: External hemorrhoid present. No mass or tenderness.  Musculoskeletal:  General: No tenderness. Normal range of motion.     Cervical back: Normal range of motion and neck supple.  Skin:    General: Skin is warm and dry.     Findings: No abrasion or rash.  Neurological:     Mental Status: She is alert and oriented to person, place, and time. Mental status is at baseline.     GCS: GCS eye subscore is 4. GCS verbal subscore is 5. GCS motor subscore is 6.     Cranial Nerves: No cranial nerve deficit.     Sensory: No sensory deficit.     Motor: Motor function is intact.  Psychiatric:        Attention and Perception: Attention normal.        Speech: Speech normal.        Behavior: Behavior normal.     ED Results / Procedures / Treatments   Labs (all labs ordered are listed, but only abnormal results are displayed) Labs Reviewed - No data to display  EKG None  Radiology No results  found.  Procedures Procedures    Medications Ordered in ED Medications - No data to display  ED Course/ Medical Decision Making/ A&P                                 Medical Decision Making  Bright red blood noted on DRE.  Suspect anal fissure versus internal hemorrhoid.  Small external hemorrhoid noted as well 2.  Will treat with nicardipine cream and patient to follow-up with her doctor        Final Clinical Impression(s) / ED Diagnoses Final diagnoses:  None    Rx / DC Orders ED Discharge Orders     None         Lind Repine, MD 09/19/23 1435

## 2023-09-19 NOTE — ED Triage Notes (Addendum)
 Patient started having rectal bleeding today. Has happened before and got cream and it went away for months and just came back. Bright red in color. Denies blood thinners. Denies abdominal pain. Denies dizziness.

## 2023-09-19 NOTE — Discharge Instructions (Signed)
 Call your doctor on Monday to schedule a follow-up visit.  Return here for any severe bleeding
